# Patient Record
Sex: Male | Born: 1975 | Race: White | Hispanic: No | Marital: Married | Smoking: Former smoker
Health system: Southern US, Community
[De-identification: ages and names within clinical notes are randomized; demographics above are authoritative.]

## PROBLEM LIST (undated history)

## (undated) DIAGNOSIS — T7840XA Allergy, unspecified, initial encounter: Secondary | ICD-10-CM

## (undated) DIAGNOSIS — K219 Gastro-esophageal reflux disease without esophagitis: Secondary | ICD-10-CM

## (undated) DIAGNOSIS — R011 Cardiac murmur, unspecified: Secondary | ICD-10-CM

## (undated) HISTORY — DX: Gastro-esophageal reflux disease without esophagitis: K21.9

## (undated) HISTORY — DX: Allergy, unspecified, initial encounter: T78.40XA

## (undated) HISTORY — DX: Cardiac murmur, unspecified: R01.1

---

## 2011-01-12 ENCOUNTER — Ambulatory Visit (INDEPENDENT_AMBULATORY_CARE_PROVIDER_SITE_OTHER): Payer: 59

## 2011-01-12 DIAGNOSIS — J019 Acute sinusitis, unspecified: Secondary | ICD-10-CM

## 2011-01-12 DIAGNOSIS — R5381 Other malaise: Secondary | ICD-10-CM

## 2011-01-12 DIAGNOSIS — R05 Cough: Secondary | ICD-10-CM

## 2011-04-05 ENCOUNTER — Ambulatory Visit (INDEPENDENT_AMBULATORY_CARE_PROVIDER_SITE_OTHER): Payer: 59 | Admitting: Family Medicine

## 2011-04-05 VITALS — BP 139/86 | HR 87 | Temp 98.2°F | Resp 16 | Ht 72.25 in | Wt 285.8 lb

## 2011-04-05 DIAGNOSIS — J069 Acute upper respiratory infection, unspecified: Secondary | ICD-10-CM

## 2011-04-05 DIAGNOSIS — R05 Cough: Secondary | ICD-10-CM

## 2011-04-05 MED ORDER — AZITHROMYCIN 250 MG PO TABS: ORAL_TABLET | ORAL | Status: AC

## 2011-04-05 MED ORDER — HYDROCODONE-HOMATROPINE 5-1.5 MG/5ML PO SYRP: 5.0000 mL | ORAL_SOLUTION | Freq: Three times a day (TID) | ORAL | Status: AC | PRN

## 2011-04-05 NOTE — Progress Notes (Signed)
  Subjective:    Patient ID: Jay Townsend, male    DOB: 08-07-1975, 36 y.o.   MRN: [id number]  HPI 36 yo male here with cough for 3 days.  Started with head congestion Friday.  Cough developed and now hurting in ribs to cough.  Still with head congestion. Cough keeping him awake at night.  No fevers.  Cough productive clear.  "light" smoker.  Throat, ears fine.   Taking tylenol cold and flu - took edge off but not all the way.    Review of Systems Negative except as per HPI     Objective:   Physical Exam  Constitutional: He appears well-developed. No distress.  HENT:  Right Ear: Tympanic membrane, external ear and ear canal normal. Tympanic membrane is not injected, not scarred, not perforated, not erythematous, not retracted and not bulging.  Left Ear: Tympanic membrane, external ear and ear canal normal. Tympanic membrane is not injected, not scarred, not perforated, not erythematous, not retracted and not bulging.  Nose: No mucosal edema or rhinorrhea. Right sinus exhibits no maxillary sinus tenderness and no frontal sinus tenderness. Left sinus exhibits no maxillary sinus tenderness and no frontal sinus tenderness.  Mouth/Throat: Uvula is midline, oropharynx is clear and moist and mucous membranes are normal. No oropharyngeal exudate or tonsillar abscesses.  Cardiovascular: Normal rate, regular rhythm, normal heart sounds and intact distal pulses.   No murmur heard. Pulmonary/Chest: Effort normal and breath sounds normal. No respiratory distress. He has no wheezes. He has no rales.  Lymphadenopathy:       Head (right side): No submandibular and no preauricular adenopathy present.       Head (left side): No submandibular and no preauricular adenopathy present.       Right cervical: No superficial cervical and no posterior cervical adenopathy present.      Left cervical: No superficial cervical and no posterior cervical adenopathy present.       Right: No supraclavicular adenopathy  present.       Left: No supraclavicular adenopathy present.  Skin: Skin is warm and dry.          Assessment & Plan:  Cough, URI - hycodan for sleep.  Zpak to take if not improving in 3-4 days or worsens.

## 2011-04-21 ENCOUNTER — Ambulatory Visit (INDEPENDENT_AMBULATORY_CARE_PROVIDER_SITE_OTHER): Payer: 59 | Admitting: Family Medicine

## 2011-04-21 VITALS — BP 133/87 | HR 90 | Temp 98.7°F | Resp 18 | Ht 71.0 in | Wt 285.0 lb

## 2011-04-21 DIAGNOSIS — J40 Bronchitis, not specified as acute or chronic: Secondary | ICD-10-CM

## 2011-04-21 MED ORDER — PREDNISONE 20 MG PO TABS: ORAL_TABLET | ORAL | Status: AC

## 2011-04-21 NOTE — Progress Notes (Signed)
  Subjective:    Patient ID: Jay Townsend, male    DOB: 02-Dec-1975, 36 y.o.   MRN: [id number]  HPI 36 yo male with cough.  Seen 4/1 and diagnosed with URI.  Given hycodan to help with sleep and zpak to take if not better in 3-4 days.  Did take it and still coughs.  Out of hycodan - stopped helping halfway through bottle.  No nasal, throat, ear symptoms.  No fever.  No shortness of breath.    Review of Systems Negative except as per HPI     Objective:   Physical Exam  Constitutional: He appears well-developed. No distress.  HENT:  Right Ear: Tympanic membrane, external ear and ear canal normal. Tympanic membrane is not injected, not scarred, not perforated, not erythematous, not retracted and not bulging.  Left Ear: Tympanic membrane, external ear and ear canal normal. Tympanic membrane is not injected, not scarred, not perforated, not erythematous, not retracted and not bulging.  Nose: No mucosal edema or rhinorrhea. Right sinus exhibits no maxillary sinus tenderness and no frontal sinus tenderness. Left sinus exhibits no maxillary sinus tenderness and no frontal sinus tenderness.  Mouth/Throat: Uvula is midline, oropharynx is clear and moist and mucous membranes are normal. No oropharyngeal exudate or tonsillar abscesses.  Cardiovascular: Normal rate, regular rhythm, normal heart sounds and intact distal pulses.   No murmur heard. Pulmonary/Chest: Effort normal and breath sounds normal. No respiratory distress. He has no wheezes. He has no rales.  Lymphadenopathy:       Head (right side): No submandibular and no preauricular adenopathy present.       Head (left side): No submandibular and no preauricular adenopathy present.       Right cervical: No superficial cervical and no posterior cervical adenopathy present.      Left cervical: No superficial cervical and no posterior cervical adenopathy present.       Right: No supraclavicular adenopathy present.       Left: No supraclavicular  adenopathy present.  Skin: Skin is warm and dry.          Assessment & Plan:  Cough, bronchitis - predtaper.

## 2011-04-26 ENCOUNTER — Ambulatory Visit (INDEPENDENT_AMBULATORY_CARE_PROVIDER_SITE_OTHER): Payer: 59 | Admitting: Emergency Medicine

## 2011-04-26 ENCOUNTER — Ambulatory Visit: Payer: 59

## 2011-04-26 VITALS — BP 142/82 | HR 80 | Temp 98.5°F | Resp 16 | Ht 71.0 in | Wt 296.6 lb

## 2011-04-26 DIAGNOSIS — R05 Cough: Secondary | ICD-10-CM

## 2011-04-26 DIAGNOSIS — R0602 Shortness of breath: Secondary | ICD-10-CM

## 2011-04-26 LAB — POCT CBC
Lymph, poc: 2 (ref 0.6–3.4)
MCH, POC: 28.5 pg (ref 27–31.2)
MCHC: 32.4 g/dL (ref 31.8–35.4)
MID (cbc): 0.4 (ref 0–0.9)
MPV: 8.6 fL (ref 0–99.8)
POC LYMPH PERCENT: 22.2 %L (ref 10–50)
POC MID %: 4.5 %M (ref 0–12)
Platelet Count, POC: 348 10*3/uL (ref 142–424)
RDW, POC: 12.7 %
WBC: 8.8 10*3/uL (ref 4.6–10.2)

## 2011-04-26 NOTE — Progress Notes (Signed)
  Subjective:    Patient ID: Jay Townsend, male    DOB: 15-Sep-1975, 36 y.o.   MRN: [id number]  HPI patient was seen earlier in April. He was treated as an upper respiratory infection. He initially was treated with a Z-Pak. He came back approximately 5 days ago because of increasing cough. He was treated with a prednisone taper. He woke up this morning and was very short of breath. He had some symptoms last night. He's had a mild cough. He denies chest pain. Let's night he had 2 episodes of coughing and had to sit up a to feel better .This  Morning just getting dressed to get ready to go to work he felt short of breath and felt like he couldn't go to work because of the shortness of breath.He denies hemoptysis or calf pain.    Review of Systems     Objective:   Physical Exam His HEENT exam reveals wax impaction of both external canals. His throat is clear. Chest exam reveals diminished breath sounds on the right. Calves reveal no tenderness or swelling. His cardiac exam reveals a regular rate and rhythm no murmurs rubs or gallops patient  [facility] reading (PRIMARY) by  Dr.Aleyna Cueva chest x-ray shows cardiomegaly with predominant markings in the bases  EKG normal sinus rhythm ST-T changes inferiorly.     Assessment & Plan:  We'll start with a CBC and chest x-ray. Following this we'll get an EKG if these initial tests are unrevealing. 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 Will have patient evaluated by Dr. Jacinto Halim.

## 2011-10-31 ENCOUNTER — Telehealth: Payer: Self-pay

## 2011-10-31 ENCOUNTER — Ambulatory Visit (INDEPENDENT_AMBULATORY_CARE_PROVIDER_SITE_OTHER): Payer: 59 | Admitting: Emergency Medicine

## 2011-10-31 VITALS — BP 125/79 | HR 86 | Temp 98.3°F | Resp 18 | Ht 71.5 in | Wt 259.0 lb

## 2011-10-31 DIAGNOSIS — R1013 Epigastric pain: Secondary | ICD-10-CM

## 2011-10-31 LAB — POCT CBC
Granulocyte percent: 66.3 %G (ref 37–80)
HCT, POC: 46.3 % (ref 43.5–53.7)
MCV: 93 fL (ref 80–97)
POC Granulocyte: 4.8 (ref 2–6.9)
RBC: 4.98 M/uL (ref 4.69–6.13)

## 2011-10-31 LAB — COMPREHENSIVE METABOLIC PANEL
ALT: 9 U/L (ref 0–53)
Albumin: 4.3 g/dL (ref 3.5–5.2)
Alkaline Phosphatase: 94 U/L (ref 39–117)
CO2: 28 mEq/L (ref 19–32)
Glucose, Bld: 87 mg/dL (ref 70–99)
Potassium: 4.4 mEq/L (ref 3.5–5.3)
Sodium: 141 mEq/L (ref 135–145)
Total Protein: 6.7 g/dL (ref 6.0–8.3)

## 2011-10-31 LAB — AMYLASE: Amylase: 35 U/L (ref 0–105)

## 2011-10-31 MED ORDER — ESOMEPRAZOLE MAGNESIUM 40 MG PO CPDR: 40.0000 mg | DELAYED_RELEASE_CAPSULE | Freq: Every day | ORAL | Status: DC

## 2011-10-31 NOTE — Telephone Encounter (Signed)
Dr Dareen Piano stated to call pharmacy and change rx to omeprazole 40mg , I contacted pharmacy and pt is aware that an alternative has been called in.

## 2011-10-31 NOTE — Telephone Encounter (Signed)
[facility] [place] states pts copay for nexium is $140 and asks for an alternative.  Pt has called them twice and pt has called here also.  Pt saw dr Ellen Henri  [facility]: [phone] Please call pt when resolved: [phone]

## 2011-10-31 NOTE — Progress Notes (Signed)
[facility] [address] [place] [address] [phone]  Date:  10/31/2011   Name:  Jay Townsend   DOB:  [birth year]   MRN:  [id number]  PCP:  Tally Due, MD    Chief Complaint: Abdominal Pain   History of Present Illness:  Jay Townsend is a 36 y.o. very pleasant male patient who presents with the following:  Ill since Friday with epigastric-RUQ pain associated with fatty meals.  Occasional heartburn.  No nausea or vomiting.  Has some right shoulder blade pain with the pain in his abdomen.  No history of gallbladder disease.  No fever or chills, icterus or stool change.  No OTC treatment.  No pain yesterday morning as he did not eat breakfast and is fasting now with no pain this morning.  Had pain with meals Friday night, lunch and supper yesterday.  Drinks a lot of carbonated, caffeine beverages.  No excess NSAID or ASA.  Smoker   There is no problem list on file for this patient.   No past medical history on file.  No past surgical history on file.  History  Substance Use Topics  . Smoking status: Current Some Day Smoker  . Smokeless tobacco: Not on file  . Alcohol Use: Not on file    No family history on file.  Allergies  Allergen Reactions  . Penicillins Nausea And Vomiting    Medication list has been reviewed and updated.  No current outpatient prescriptions on file prior to visit.    Review of Systems:  As per HPI, otherwise negative.    Physical Examination: Filed Vitals:   10/31/11 0834  BP: 125/79  Pulse: 86  Temp: 98.3 F (36.8 C)  Resp: 18   Filed Vitals:   10/31/11 0834  Height: 5' 11.5" (1.816 m)  Weight: 259 lb (117.482 kg)   Body mass index is 35.62 kg/(m^2). Ideal Body Weight: Weight in (lb) to have BMI = 25: 181.4   GEN: WDWN, NAD, Non-toxic, A & O x 3 HEENT: Atraumatic, Normocephalic. Neck supple. No masses, No LAD. Ears and Nose: No external deformity. CV: RRR, No M/G/R. No JVD. No  thrill. No extra heart sounds. PULM: CTA B, no wheezes, crackles, rhonchi. No retractions. No resp. distress. No accessory muscle use. ABD: S, tender epigastrium, ND, +BS. No rebound. No HSM. EXTR: No c/c/e NEURO Normal gait.  PSYCH: Normally interactive. Conversant. Not depressed or anxious appearing.  Calm demeanor.    Assessment and Plan: Abdominal pain Labs nexium Follow up as needed and for failure to improve.  Carmelina Dane, MD  Results for orders placed in visit on 10/31/11  H. PYLORI ANTIBODY, IGG      Result Value Range   H Pylori IgG <0.40    COMPREHENSIVE METABOLIC PANEL      Result Value Range   Sodium 141  135 - 145 mEq/L   Potassium 4.4  3.5 - 5.3 mEq/L   Chloride 108  96 - 112 mEq/L   CO2 28  19 - 32 mEq/L   Glucose, Bld 87  70 - 99 mg/dL   BUN 10  6 - 23 mg/dL   Creat 2.95  6.21 - 3.08 mg/dL   Total Bilirubin 1.0  0.3 - 1.2 mg/dL   Alkaline Phosphatase 94  39 - 117 U/L   AST 14  0 - 37 U/L   ALT 9  0 - 53 U/L   Total Protein 6.7  6.0 - 8.3 g/dL   Albumin 4.3  3.5 - 5.2 g/dL   Calcium 9.8  8.4 - 16.1 mg/dL  AMYLASE      Result Value Range   Amylase 35  0 - 105 U/L  LIPASE      Result Value Range   Lipase 12  0 - 75 U/L  POCT CBC      Result Value Range   WBC 7.3  4.6 - 10.2 K/uL   Lymph, poc 2.0  0.6 - 3.4   POC LYMPH PERCENT 27.2  10 - 50 %L   MID (cbc) 0.5  0 - 0.9   POC MID % 6.5  0 - 12 %M   POC Granulocyte 4.8  2 - 6.9   Granulocyte percent 66.3  37 - 80 %G   RBC 4.98  4.69 - 6.13 M/uL   Hemoglobin 14.6  14.1 - 18.1 g/dL   HCT, POC 09.6  04.5 - 53.7 %   MCV 93.0  80 - 97 fL   MCH, POC 29.3  27 - 31.2 pg   MCHC 31.5 (*) 31.8 - 35.4 g/dL   RDW, POC 40.9     Platelet Count, POC 316  142 - 424 K/uL   MPV 8.8  0 - 99.8 fL   I have reviewed and agree with documentation. Robert P. Merla Riches, M.D.

## 2011-11-01 ENCOUNTER — Telehealth: Payer: Self-pay

## 2011-11-01 LAB — H. PYLORI ANTIBODY, IGG: H Pylori IgG: <0.4 {ISR}

## 2011-11-01 NOTE — Telephone Encounter (Signed)
Pt is looking for lab results

## 2011-11-02 NOTE — Telephone Encounter (Signed)
Patient has been advised on voicemail of normal lab results.

## 2012-01-21 ENCOUNTER — Ambulatory Visit (INDEPENDENT_AMBULATORY_CARE_PROVIDER_SITE_OTHER): Payer: 59 | Admitting: Family Medicine

## 2012-01-21 VITALS — BP 144/98 | HR 87 | Temp 98.7°F | Resp 18 | Ht 71.0 in | Wt 263.0 lb

## 2012-01-21 DIAGNOSIS — K219 Gastro-esophageal reflux disease without esophagitis: Secondary | ICD-10-CM

## 2012-01-21 DIAGNOSIS — K2970 Gastritis, unspecified, without bleeding: Secondary | ICD-10-CM

## 2012-01-21 DIAGNOSIS — R0602 Shortness of breath: Secondary | ICD-10-CM

## 2012-01-21 LAB — POCT CBC
Granulocyte percent: 63.7 %G (ref 37–80)
HCT, POC: 46.1 % (ref 43.5–53.7)
Hemoglobin: 14.6 g/dL (ref 14.1–18.1)
POC Granulocyte: 5 (ref 2–6.9)
RBC: 4.98 M/uL (ref 4.69–6.13)
RDW, POC: 13 %

## 2012-01-21 MED ORDER — SUCRALFATE 1 GM/10ML PO SUSP: 1.0000 g | Freq: Four times a day (QID) | ORAL | Status: DC

## 2012-01-21 MED ORDER — OMEPRAZOLE 40 MG PO CPDR: 40.0000 mg | DELAYED_RELEASE_CAPSULE | Freq: Every day | ORAL | Status: DC

## 2012-01-21 NOTE — Patient Instructions (Addendum)
Try to avoid the triggers for reflux as below, and decrease caffeine intake. Continue the acid blocker once per day. Can add the carafate 4 time per day for this next 5-7 days. Return to the clinic or go to the nearest emergency room if any of your symptoms worsen or new symptoms occur. If your shortness of breath persists, or any worsening/new symptoms including any chest pain - go to an emergency room.   Diet for Gastroesophageal Reflux Disease, Adult Reflux (acid reflux) is when acid from your stomach flows up into the esophagus. When acid comes in contact with the esophagus, the acid causes irritation and soreness (inflammation) in the esophagus. When reflux happens often or so severely that it causes damage to the esophagus, it is called gastroesophageal reflux disease (GERD). Nutrition therapy can help ease the discomfort of GERD. FOODS OR DRINKS TO AVOID OR LIMIT  Smoking or chewing tobacco. Nicotine is one of the most potent stimulants to acid production in the gastrointestinal tract.  Caffeinated and decaffeinated coffee and black tea.  Regular or low-calorie carbonated beverages or energy drinks (caffeine-free carbonated beverages are allowed).   Strong spices, such as black pepper, white pepper, red pepper, cayenne, curry powder, and chili powder.  Peppermint or spearmint.  Chocolate.  High-fat foods, including meats and fried foods. Extra added fats including oils, butter, salad dressings, and nuts. Limit these to less than 8 tsp per day.  Fruits and vegetables if they are not tolerated, such as citrus fruits or tomatoes.  Alcohol.  Any food that seems to aggravate your condition. If you have questions regarding your diet, call your caregiver or a registered dietitian. OTHER THINGS THAT MAY HELP GERD INCLUDE:   Eating your meals slowly, in a relaxed setting.  Eating 5 to 6 small meals per day instead of 3 large meals.  Eliminating food for a period of time if it causes  distress.  Not lying down until 3 hours after eating a meal.  Keeping the head of your bed raised 6 to 9 inches (15 to 23 cm) by using a foam wedge or blocks under the legs of the bed. Lying flat may make symptoms worse.  Being physically active. Weight loss may be helpful in reducing reflux in overweight or obese adults.  Wear loose fitting clothing EXAMPLE MEAL PLAN This meal plan is approximately 2,000 calories based on https://www.[facility].org/ meal planning guidelines. Breakfast   cup cooked oatmeal.  1 cup strawberries.  1 cup low-fat milk.  1 oz almonds. Snack  1 cup cucumber slices.  6 oz yogurt (made from low-fat or fat-free milk). Lunch  2 slice whole-wheat bread.  2 oz sliced [place].  2 tsp mayonnaise.  1 cup blueberries.  1 cup snap peas. Snack  6 whole-wheat crackers.  1 oz string cheese. Dinner   cup brown rice.  1 cup mixed veggies.  1 tsp olive oil.  3 oz grilled fish. Document Released: 12/21/2004 Document Revised: 03/15/2011 Document Reviewed: 11/06/2010 [facility] Patient Information 2013 [place], [place]. Gastroesophageal Reflux Disease, Adult Gastroesophageal reflux disease (GERD) happens when acid from your stomach flows up into the esophagus. When acid comes in contact with the esophagus, the acid causes soreness (inflammation) in the esophagus. Over time, GERD may create small holes (ulcers) in the lining of the esophagus. CAUSES   Increased body weight. This puts pressure on the stomach, making acid rise from the stomach into the esophagus.  Smoking. This increases acid production in the stomach.  Drinking alcohol. This  causes decreased pressure in the lower esophageal sphincter (valve or ring of muscle between the esophagus and stomach), allowing acid from the stomach into the esophagus.  Late evening meals and a full stomach. This increases pressure and acid production in the stomach.  A malformed lower esophageal  sphincter. Sometimes, no cause is found. SYMPTOMS   Burning pain in the lower part of the mid-chest behind the breastbone and in the mid-stomach area. This may occur twice a week or more often.  Trouble swallowing.  Sore throat.  Dry cough.  Asthma-like symptoms including chest tightness, shortness of breath, or wheezing. DIAGNOSIS  Your caregiver may be able to diagnose GERD based on your symptoms. In some cases, X-rays and other tests may be done to check for complications or to check the condition of your stomach and esophagus. TREATMENT  Your caregiver may recommend over-the-counter or prescription medicines to help decrease acid production. Ask your caregiver before starting or adding any new medicines.  HOME CARE INSTRUCTIONS   Change the factors that you can control. Ask your caregiver for guidance concerning weight loss, quitting smoking, and alcohol consumption.  Avoid foods and drinks that make your symptoms worse, such as:  Caffeine or alcoholic drinks.  Chocolate.  Peppermint or mint flavorings.  Garlic and onions.  Spicy foods.  Citrus fruits, such as oranges, lemons, or limes.  Tomato-based foods such as sauce, chili, salsa, and pizza.  Fried and fatty foods.  Avoid lying down for the 3 hours prior to your bedtime or prior to taking a nap.  Eat small, frequent meals instead of large meals.  Wear loose-fitting clothing. Do not wear anything tight around your waist that causes pressure on your stomach.  Raise the head of your bed 6 to 8 inches with wood blocks to help you sleep. Extra pillows will not help.  Only take over-the-counter or prescription medicines for pain, discomfort, or fever as directed by your caregiver.  Do not take aspirin, ibuprofen, or other nonsteroidal anti-inflammatory drugs (NSAIDs). SEEK IMMEDIATE MEDICAL CARE IF:   You have pain in your arms, neck, jaw, teeth, or back.  Your pain increases or changes in intensity or  duration.  You develop nausea, vomiting, or sweating (diaphoresis).  You develop shortness of breath, or you faint.  Your vomit is green, yellow, black, or looks like coffee grounds or blood.  Your stool is red, bloody, or black. These symptoms could be signs of other problems, such as heart disease, gastric bleeding, or esophageal bleeding. MAKE SURE YOU:   Understand these instructions.  Will watch your condition.  Will get help right away if you are not doing well or get worse. Document Released: 09/30/2004 Document Revised: 03/15/2011 Document Reviewed: 07/10/2010 [facility] Patient Information 2013 [place], [place].

## 2012-01-21 NOTE — Progress Notes (Signed)
Subjective:    Patient ID: Jay Townsend, male    DOB: 27-Jan-1975, 37 y.o.   MRN: [id number]  HPI Jay Townsend is a 37 y.o. male  Hx of reflux - initially started in October - took omeprazole daily since then.  Had been doing better - no breakthrough symptoms until 3 days ago. Feels intermittent heartburn since then.  Mild belching with this.  Eats spicy foods, but less recently. Discomfort at upper abdomen.  Intermittent dyspnea with exertion at work.  No chest pain.   2-3 12 ounce cans of diet mtn dew per day.   H pylori negative 10/31/11.  Review of Systems  Constitutional: Negative for fever and chills.  Respiratory: Positive for shortness of breath (with exertion at work.  noted more in past few days. ). Negative for chest tightness.   Cardiovascular: Negative for chest pain.  Gastrointestinal: Negative for nausea, vomiting, diarrhea, blood in stool and anal bleeding. Constipation: intermittent. harder stool this morning.        Objective:   Physical Exam  Constitutional: He is oriented to person, place, and time. He appears well-developed and well-nourished.  HENT:  Head: Normocephalic and atraumatic.  Eyes: EOM are normal. Pupils are equal, round, and reactive to light.  Neck: No JVD present. Carotid bruit is not present.  Cardiovascular: Normal rate, regular rhythm and normal heart sounds.   No murmur heard. Pulmonary/Chest: Effort normal and breath sounds normal. He has no rales.  Abdominal: Soft. Normal appearance and bowel sounds are normal. There is tenderness in the epigastric area. There is no rebound, no guarding and no CVA tenderness.  Musculoskeletal: He exhibits no edema.  Neurological: He is alert and oriented to person, place, and time.  Skin: Skin is warm and dry.  Psychiatric: He has a normal mood and affect.   EKG: SR, nonspecific t in III, no acute findings.   Results for orders placed in visit on 01/21/12  POCT CBC      Component Value Range   WBC 7.8  4.6 - 10.2 K/uL   Lymph, poc 2.4  0.6 - 3.4   POC LYMPH PERCENT 31.3  10 - 50 %L   MID (cbc) 0.4  0 - 0.9   POC MID % 5.0  0 - 12 %M   POC Granulocyte 5.0  2 - 6.9   Granulocyte percent 63.7  37 - 80 %G   RBC 4.98  4.69 - 6.13 M/uL   Hemoglobin 14.6  14.1 - 18.1 g/dL   HCT, POC 40.9  81.1 - 53.7 %   MCV 92.6  80 - 97 fL   MCH, POC 29.3  27 - 31.2 pg   MCHC 31.7 (*) 31.8 - 35.4 g/dL   RDW, POC 91.4     Platelet Count, POC 314  142 - 424 K/uL   MPV 8.7  0 - 99.8 fL       Assessment & Plan:  Jay Townsend is a 37 y.o. male 1. SOB (shortness of breath)  EKG 12-Lead, POCT CBC.  Reassuring ekg overall.  May be due to GERD sx's, but rt/er/911 precautions discussed.   2. Acid reflux  POCT CBC, sucralfate (CARAFATE) 1 GM/10ML suspension - next 5-7 days.  Trigger avoidance. Recheck if not improving in next week - sooner if worse. Plan oin recheck GERD status in few weeks as may need GI eval if persistent sx's.  3. Gastritis  sucralfate (CARAFATE) 1 GM/10ML suspension   Patient Instructions  Try to avoid the triggers for reflux as below, and decrease caffeine intake. Continue the acid blocker once per day. Can add the carafate 4 time per day for this next 5-7 days. Return to the clinic or go to the nearest emergency room if any of your symptoms worsen or new symptoms occur. If your shortness of breath persists, or any worsening/new symptoms including any chest pain - go to an emergency room.   Diet for Gastroesophageal Reflux Disease, Adult Reflux (acid reflux) is when acid from your stomach flows up into the esophagus. When acid comes in contact with the esophagus, the acid causes irritation and soreness (inflammation) in the esophagus. When reflux happens often or so severely that it causes damage to the esophagus, it is called gastroesophageal reflux disease (GERD). Nutrition therapy can help ease the discomfort of GERD. FOODS OR DRINKS TO AVOID OR LIMIT  Smoking or chewing  tobacco. Nicotine is one of the most potent stimulants to acid production in the gastrointestinal tract.  Caffeinated and decaffeinated coffee and black tea.  Regular or low-calorie carbonated beverages or energy drinks (caffeine-free carbonated beverages are allowed).   Strong spices, such as black pepper, white pepper, red pepper, cayenne, curry powder, and chili powder.  Peppermint or spearmint.  Chocolate.  High-fat foods, including meats and fried foods. Extra added fats including oils, butter, salad dressings, and nuts. Limit these to less than 8 tsp per day.  Fruits and vegetables if they are not tolerated, such as citrus fruits or tomatoes.  Alcohol.  Any food that seems to aggravate your condition. If you have questions regarding your diet, call your caregiver or a registered dietitian. OTHER THINGS THAT MAY HELP GERD INCLUDE:   Eating your meals slowly, in a relaxed setting.  Eating 5 to 6 small meals per day instead of 3 large meals.  Eliminating food for a period of time if it causes distress.  Not lying down until 3 hours after eating a meal.  Keeping the head of your bed raised 6 to 9 inches (15 to 23 cm) by using a foam wedge or blocks under the legs of the bed. Lying flat may make symptoms worse.  Being physically active. Weight loss may be helpful in reducing reflux in overweight or obese adults.  Wear loose fitting clothing EXAMPLE MEAL PLAN This meal plan is approximately 2,000 calories based on https://www.bernard.org/ meal planning guidelines. Breakfast   cup cooked oatmeal.  1 cup strawberries.  1 cup low-fat milk.  1 oz almonds. Snack  1 cup cucumber slices.  6 oz yogurt (made from low-fat or fat-free milk). Lunch  2 slice whole-wheat bread.  2 oz sliced [place].  2 tsp mayonnaise.  1 cup blueberries.  1 cup snap peas. Snack  6 whole-wheat crackers.  1 oz string cheese. Dinner   cup brown rice.  1 cup mixed veggies.  1 tsp  olive oil.  3 oz grilled fish. Document Released: 12/21/2004 Document Revised: 03/15/2011 Document Reviewed: 11/06/2010 [facility] Patient Information 2013 [place], [place]. Gastroesophageal Reflux Disease, Adult Gastroesophageal reflux disease (GERD) happens when acid from your stomach flows up into the esophagus. When acid comes in contact with the esophagus, the acid causes soreness (inflammation) in the esophagus. Over time, GERD may create small holes (ulcers) in the lining of the esophagus. CAUSES   Increased body weight. This puts pressure on the stomach, making acid rise from the stomach into the esophagus.  Smoking. This increases acid production in the stomach.  Drinking alcohol.  This causes decreased pressure in the lower esophageal sphincter (valve or ring of muscle between the esophagus and stomach), allowing acid from the stomach into the esophagus.  Late evening meals and a full stomach. This increases pressure and acid production in the stomach.  A malformed lower esophageal sphincter. Sometimes, no cause is found. SYMPTOMS   Burning pain in the lower part of the mid-chest behind the breastbone and in the mid-stomach area. This may occur twice a week or more often.  Trouble swallowing.  Sore throat.  Dry cough.  Asthma-like symptoms including chest tightness, shortness of breath, or wheezing. DIAGNOSIS  Your caregiver may be able to diagnose GERD based on your symptoms. In some cases, X-rays and other tests may be done to check for complications or to check the condition of your stomach and esophagus. TREATMENT  Your caregiver may recommend over-the-counter or prescription medicines to help decrease acid production. Ask your caregiver before starting or adding any new medicines.  HOME CARE INSTRUCTIONS   Change the factors that you can control. Ask your caregiver for guidance concerning weight loss, quitting smoking, and alcohol consumption.  Avoid foods and drinks  that make your symptoms worse, such as:  Caffeine or alcoholic drinks.  Chocolate.  Peppermint or mint flavorings.  Garlic and onions.  Spicy foods.  Citrus fruits, such as oranges, lemons, or limes.  Tomato-based foods such as sauce, chili, salsa, and pizza.  Fried and fatty foods.  Avoid lying down for the 3 hours prior to your bedtime or prior to taking a nap.  Eat small, frequent meals instead of large meals.  Wear loose-fitting clothing. Do not wear anything tight around your waist that causes pressure on your stomach.  Raise the head of your bed 6 to 8 inches with wood blocks to help you sleep. Extra pillows will not help.  Only take over-the-counter or prescription medicines for pain, discomfort, or fever as directed by your caregiver.  Do not take aspirin, ibuprofen, or other nonsteroidal anti-inflammatory drugs (NSAIDs). SEEK IMMEDIATE MEDICAL CARE IF:   You have pain in your arms, neck, jaw, teeth, or back.  Your pain increases or changes in intensity or duration.  You develop nausea, vomiting, or sweating (diaphoresis).  You develop shortness of breath, or you faint.  Your vomit is green, yellow, black, or looks like coffee grounds or blood.  Your stool is red, bloody, or black. These symptoms could be signs of other problems, such as heart disease, gastric bleeding, or esophageal bleeding. MAKE SURE YOU:   Understand these instructions.  Will watch your condition.  Will get help right away if you are not doing well or get worse. Document Released: 09/30/2004 Document Revised: 03/15/2011 Document Reviewed: 07/10/2010 [facility] Patient Information 2013 [place], [place].

## 2012-02-01 ENCOUNTER — Ambulatory Visit (INDEPENDENT_AMBULATORY_CARE_PROVIDER_SITE_OTHER): Payer: 59 | Admitting: Physician Assistant

## 2012-02-01 VITALS — BP 126/78 | HR 90 | Temp 99.0°F | Resp 16 | Ht 71.0 in | Wt 257.0 lb

## 2012-02-01 DIAGNOSIS — R05 Cough: Secondary | ICD-10-CM

## 2012-02-01 DIAGNOSIS — J111 Influenza due to unidentified influenza virus with other respiratory manifestations: Secondary | ICD-10-CM

## 2012-02-01 DIAGNOSIS — R509 Fever, unspecified: Secondary | ICD-10-CM

## 2012-02-01 LAB — POCT INFLUENZA A/B
Influenza A, POC: NEGATIVE
Influenza B, POC: NEGATIVE

## 2012-02-01 MED ORDER — HYDROCODONE-HOMATROPINE 5-1.5 MG/5ML PO SYRP: ORAL_SOLUTION | ORAL | Status: DC

## 2012-02-01 MED ORDER — OSELTAMIVIR PHOSPHATE 75 MG PO CAPS: 75.0000 mg | ORAL_CAPSULE | Freq: Two times a day (BID) | ORAL | Status: DC

## 2012-02-01 NOTE — Progress Notes (Signed)
Patient ID: Jay Townsend MRN: [id number], DOB: [birth year], 37 y.o. Date of Encounter: 02/01/2012, 9:15 AM  Primary Physician: Tally Due, MD  Chief Complaint: Sudden onset of fever, chills, myalgias, and congesiton  HPI: 37 y.o. year old male with history below presents with a one day history of sudden onset of sore throat, fever, chills, myalgias, nasal congestion, cough, and headache. T max uncertain. He developed onset of above symptoms while at work the previous day followed by worsening over the evening and night. Cough is nonproductive. No shortness of breath. Some wheezing. Felt so bad the previous night that he stayed on the sofa. No GI complaints. Decreased appetite. Boss with influenza a week or two ago. No influenza vaccine this year.  Past Medical History  Diagnosis Date  . GERD (gastroesophageal reflux disease)      Home Meds: Prior to Admission medications   Medication Sig Start Date End Date Taking? Authorizing Provider  omeprazole (PRILOSEC) 40 MG capsule Take 1 capsule (40 mg total) by mouth daily. 01/21/12  Yes Shade Flood, MD  sucralfate (CARAFATE) 1 GM/10ML suspension Take 10 mLs (1 g total) by mouth 4 (four) times daily. 01/21/12  Yes Shade Flood, MD           Allergies:  Allergies  Allergen Reactions  . Penicillins Nausea And Vomiting    History   Social History  . Marital Status: Married    Spouse Name: N/A    Number of Children: N/A  . Years of Education: N/A   Occupational History  . Not on file.   Social History Main Topics  . Smoking status: Current Some Day Smoker  . Smokeless tobacco: Not on file  . Alcohol Use: No  . Drug Use: No  . Sexually Active: Not on file   Other Topics Concern  . Not on file   Social History Narrative  . No narrative on file     Review of Systems: Constitutional: Positive for fever, chills, myalgias, and fatigue. Negative for weight changes.  HEENT: see above Cardiovascular: Negative  for chest pain or palpitations Respiratory: Positive for cough and wheezing. Negative for hemoptysis, shortness of breath, tachypnea, or dyspnea Abdominal: Negative for abdominal pain, nausea, vomiting, diarrhea, or constipation Dermatological: Negative for rash. Neurologic: Positive for headache. Negative for dizziness, vertigo, or syncope.   Physical Exam: Blood pressure 126/78, pulse 90, temperature 99 F (37.2 C), temperature source Oral, resp. rate 16, height 5\' 11"  (1.803 m), weight 257 lb (116.574 kg), SpO2 97.00%., Body mass index is 35.84 kg/(m^2). General: Well developed, well nourished, in no acute distress. Head: Normocephalic, atraumatic, eyes without discharge, sclera non-icteric, nares are congested. Bilateral auditory canals clear, TM's are without perforation, pearly grey and translucent with reflective cone of light bilaterally. Oral cavity moist, posterior pharynx without exudate, erythema, peritonsillar abscess, or post nasal drip. Uvula midline.  Neck: Supple. No thyromegaly. Full ROM. No lymphadenopathy. Lungs: Clear bilaterally to auscultation without wheezes, rales, or rhonchi. Breathing is unlabored. Heart: RRR with S1 S2. No murmurs, rubs, or gallops appreciated.  Msk:  Strength and tone normal for age. Extremities/Skin: Warm and dry. No clubbing or cyanosis. No edema. No rashes or suspicious lesions. Neuro: Alert and oriented X 3. Moves all extremities spontaneously. Gait is normal. CNII-XII grossly in tact. Psych:  Responds to questions appropriately with a normal affect.   Labs: Results for orders placed in visit on 02/01/12  POCT INFLUENZA A/B      Component Value  Range   Influenza A, POC Negative     Influenza B, POC Negative       ASSESSMENT AND PLAN:  37 y.o. year old male with influenza, fever, and cough. -Tamiflu 75 mg 1 po bid #10 no RF -Hycodan #4oz 1 tsp po q 4-6 hours prn cough no RF, SED -Tylenol/Motrin -Push fluids -Rest -Will need to be  out of work until afebrile 24 hours without the aid of antipyretics -Out of work note written through 02/04/12 -RTC precautions  Signed, Eula Listen, PA-C 02/01/2012 9:15 AM

## 2012-02-26 ENCOUNTER — Ambulatory Visit (INDEPENDENT_AMBULATORY_CARE_PROVIDER_SITE_OTHER): Payer: 59 | Admitting: Internal Medicine

## 2012-02-26 VITALS — BP 124/72 | HR 59 | Temp 98.2°F | Resp 18 | Ht 71.5 in | Wt 258.0 lb

## 2012-02-26 DIAGNOSIS — H6120 Impacted cerumen, unspecified ear: Secondary | ICD-10-CM

## 2012-02-26 DIAGNOSIS — K219 Gastro-esophageal reflux disease without esophagitis: Secondary | ICD-10-CM

## 2012-02-26 DIAGNOSIS — H6123 Impacted cerumen, bilateral: Secondary | ICD-10-CM

## 2012-02-26 DIAGNOSIS — H6690 Otitis media, unspecified, unspecified ear: Secondary | ICD-10-CM

## 2012-02-26 DIAGNOSIS — Z6835 Body mass index (BMI) 35.0-35.9, adult: Secondary | ICD-10-CM

## 2012-02-26 MED ORDER — CEFDINIR 300 MG PO CAPS: 300.0000 mg | ORAL_CAPSULE | Freq: Two times a day (BID) | ORAL | Status: DC

## 2012-02-26 NOTE — Progress Notes (Signed)
  Subjective:    Patient ID: Jay Townsend, male    DOB: [birth year], 37 y.o.   MRN: [id number]  HPI complaining of pain in the left ear for 5 days /worse when burps or swallows Mild decrease in hearing Started at the end of the 6 day illness that was the flu No fever No nasal congestion Occasional early morning cough  Past medical history-reflux on omeprazole  History of chronic use of Q-tips bilaterally  Review of Systems No fever No dizziness No headache    Objective:   Physical Exam Vital signs stable except overweight Pupils equal round reactive to light and accommodation /conjunctiva clear Both canals obstructed by cerumen Nares slightly boggy Throat clear No nodes Tragus pull nontender   Bilateral ear irrigation after Colace administration was successful Exam after irrigation revealed hyperemic left tympanic membrane/both canals cleared/no external otitis Subjective improvement in hearing      Assessment & Plan:  Problem #1 otitis media followin the flu Problem #2 cerumen impaction/chronic Q-tip usage Instructed him proper care of otic canals  Meds ordered this encounter  Medications  . cefdinir (OMNICEF) 300 MG capsule    Sig: Take 1 capsule (300 mg total) by mouth 2 (two) times daily.    Dispense:  20 capsule    Refill:  0

## 2012-03-20 ENCOUNTER — Ambulatory Visit (INDEPENDENT_AMBULATORY_CARE_PROVIDER_SITE_OTHER): Payer: 59 | Admitting: Emergency Medicine

## 2012-03-20 VITALS — BP 136/84 | HR 58 | Temp 98.1°F | Resp 16 | Ht 71.5 in | Wt 260.0 lb

## 2012-03-20 DIAGNOSIS — S20229A Contusion of unspecified back wall of thorax, initial encounter: Secondary | ICD-10-CM

## 2012-03-20 DIAGNOSIS — S20221A Contusion of right back wall of thorax, initial encounter: Secondary | ICD-10-CM

## 2012-03-20 LAB — POCT URINALYSIS DIPSTICK
Bilirubin, UA: NEGATIVE
Blood, UA: NEGATIVE
Glucose, UA: NEGATIVE
Ketones, UA: NEGATIVE
Leukocytes, UA: NEGATIVE
Nitrite, UA: NEGATIVE
Protein, UA: NEGATIVE
Spec Grav, UA: 1.02
Urobilinogen, UA: 0.2
pH, UA: 6

## 2012-03-20 MED ORDER — HYDROCODONE-ACETAMINOPHEN 5-325 MG PO TABS
1.0000 | ORAL_TABLET | ORAL | Status: DC | PRN
Start: 2012-03-20 — End: 2019-01-18

## 2012-03-20 NOTE — Patient Instructions (Addendum)
Back Pain, Adult Low back pain is very common. About 1 in 5 people have back pain.The cause of low back pain is rarely dangerous. The pain often gets better over time.About half of people with a sudden onset of back pain feel better in just 2 weeks. About 8 in 10 people feel better by 6 weeks.  CAUSES Some common causes of back pain include:  Strain of the muscles or ligaments supporting the spine.  Wear and tear (degeneration) of the spinal discs.  Arthritis.  Direct injury to the back. DIAGNOSIS Most of the time, the direct cause of low back pain is not known.However, back pain can be treated effectively even when the exact cause of the pain is unknown.Answering your caregiver's questions about your overall health and symptoms is one of the most accurate ways to make sure the cause of your pain is not dangerous. If your caregiver needs more information, he or she may order lab work or imaging tests (X-rays or MRIs).However, even if imaging tests show changes in your back, this usually does not require surgery. HOME CARE INSTRUCTIONS For many people, back pain returns.Since low back pain is rarely dangerous, it is often a condition that people can learn to manageon their own.   Remain active. It is stressful on the back to sit or stand in one place. Do not sit, drive, or stand in one place for more than 30 minutes at a time. Take short walks on level surfaces as soon as pain allows.Try to increase the length of time you walk each day.  Do not stay in bed.Resting more than 1 or 2 days can delay your recovery.  Do not avoid exercise or work.Your body is made to move.It is not dangerous to be active, even though your back may hurt.Your back will likely heal faster if you return to being active before your pain is gone.  Pay attention to your body when you bend and lift. Many people have less discomfortwhen lifting if they bend their knees, keep the load close to their bodies,and  avoid twisting. Often, the most comfortable positions are those that put less stress on your recovering back.  Find a comfortable position to sleep. Use a firm mattress and lie on your side with your knees slightly bent. If you lie on your back, put a pillow under your knees.  Only take over-the-counter or prescription medicines as directed by your caregiver. Over-the-counter medicines to reduce pain and inflammation are often the most helpful.Your caregiver may prescribe muscle relaxant drugs.These medicines help dull your pain so you can more quickly return to your normal activities and healthy exercise.  Put ice on the injured area.  Put ice in a plastic bag.  Place a towel between your skin and the bag.  Leave the ice on for 15 to 20 minutes, 3 to 4 times a day for the first 2 to 3 days. After that, ice and heat may be alternated to reduce pain and spasms.  Ask your caregiver about trying back exercises and gentle massage. This may be of some benefit.  Avoid feeling anxious or stressed.Stress increases muscle tension and can worsen back pain.It is important to recognize when you are anxious or stressed and learn ways to manage it.Exercise is a great option. SEEK MEDICAL CARE IF:  You have pain that is not relieved with rest or medicine.  You have pain that does not improve in 1 week.  You have new symptoms.  You are generally   not feeling well. SEEK IMMEDIATE MEDICAL CARE IF:   You have pain that radiates from your back into your legs.  You develop new bowel or bladder control problems.  You have unusual weakness or numbness in your arms or legs.  You develop nausea or vomiting.  You develop abdominal pain.  You feel faint. Document Released: 12/21/2004 Document Revised: 06/22/2011 Document Reviewed: 05/11/2010 [employer] Patient Information 2013 [employer].  

## 2012-03-20 NOTE — Progress Notes (Signed)
[facility] [address] [place] [address] [phone]  Date:  03/20/2012   Name:  Jay Townsend   DOB:  1975/05/29   MRN:  [id number]  PCP:  Tally Due, MD    Chief Complaint: Fall   History of Present Illness:  Jay Townsend is a 37 y.o. very pleasant male patient who presents with the following:  Slipped this morning on an icy step and fell, injuring his right low back.  Denies any numbness tingling or weakness in the legs.  Pain not radiating. No hematuria, wheezing, shortness of breath or hemoptysis.  No abdominal or neck pain.  No improvement with over the counter medications or other home remedies. Denies other complaint or health concern today.   Patient Active Problem List  Diagnosis  . Esophageal reflux  . BMI 35.0-35.9,adult    Past Medical History  Diagnosis Date  . GERD (gastroesophageal reflux disease)   . Allergy   . Heart murmur     History reviewed. No pertinent past surgical history.  History  Substance Use Topics  . Smoking status: Current Some Day Smoker -- 0.10 packs/day for 5 years    Types: Cigarettes  . Smokeless tobacco: Current User    Types: Snuff  . Alcohol Use: No    Family History  Problem Relation Age of Onset  . Diabetes Father   . Drug abuse Paternal Grandmother   . Heart disease Paternal Grandfather     Allergies  Allergen Reactions  . Bee Venom Swelling    Mainly allergic to yellow jackets  . Penicillins Nausea And Vomiting    Medication list has been reviewed and updated.  Current Outpatient Prescriptions on File Prior to Visit  Medication Sig Dispense Refill  . omeprazole (PRILOSEC) 40 MG capsule Take 1 capsule (40 mg total) by mouth daily.  30 capsule  1  . cefdinir (OMNICEF) 300 MG capsule Take 1 capsule (300 mg total) by mouth 2 (two) times daily.  20 capsule  0   No current facility-administered medications on file prior to visit.    Review of Systems:  As per HPI,  otherwise negative.    Physical Examination: Filed Vitals:   03/20/12 1046  BP: 136/84  Pulse: 58  Temp: 98.1 F (36.7 C)  Resp: 16   Filed Vitals:   03/20/12 1046  Height: 5' 11.5" (1.816 m)  Weight: 260 lb (117.935 kg)   Body mass index is 35.76 kg/(m^2). Ideal Body Weight: Weight in (lb) to have BMI = 25: 181.4  GEN: WDWN, NAD, Non-toxic, A & O x 3 HEENT: Atraumatic, Normocephalic. Neck supple. No masses, No LAD. Ears and Nose: No external deformity. CV: RRR, No M/G/R. No JVD. No thrill. No extra heart sounds. PULM: CTA B, no wheezes, crackles, rhonchi. No retractions. No resp. distress. No accessory muscle use. ABD: S, NT, ND, +BS. No rebound. No HSM. EXTR: No c/c/e NEURO Normal gait.  PSYCH: Normally interactive. Conversant. Not depressed or anxious appearing.  Calm demeanor.  BACK:  Ecchymosis right CVA region with tenderness.    Assessment and Plan: Contusion back Ice vicodin   Carmelina Dane, MD  Results for orders placed in visit on 03/20/12  POCT URINALYSIS DIPSTICK      Result Value Range   Color, UA YELLOW     Clarity, UA CLEAR     Glucose, UA NEG     Bilirubin, UA NEG     Ketones, UA NEG  Spec Grav, UA 1.020     Blood, UA NEG     pH, UA 6.0     Protein, UA NEG     Urobilinogen, UA 0.2     Nitrite, UA NEG     Leukocytes, UA Negative

## 2012-03-31 NOTE — Progress Notes (Signed)
Reviewed and agree.

## 2012-04-07 ENCOUNTER — Encounter: Payer: Self-pay | Admitting: Emergency Medicine

## 2019-01-18 ENCOUNTER — Ambulatory Visit
Admission: EM | Admit: 2019-01-18 | Discharge: 2019-01-18 | Disposition: A | Discharge status: Home / Self Care | Payer: Managed Care, Other (non HMO) | Attending: Physician Assistant | Admitting: Physician Assistant

## 2019-01-18 DIAGNOSIS — Z20822 Contact with and (suspected) exposure to covid-19: Secondary | ICD-10-CM

## 2019-01-18 DIAGNOSIS — R059 Cough, unspecified: Secondary | ICD-10-CM

## 2019-01-18 DIAGNOSIS — R05 Cough: Secondary | ICD-10-CM | POA: Diagnosis not present

## 2019-01-18 MED ORDER — IPRATROPIUM BROMIDE 0.06 % NA SOLN: 2.0000 | Freq: Four times a day (QID) | NASAL | 0 refills | Status: DC

## 2019-01-18 MED ORDER — ALBUTEROL SULFATE HFA 108 (90 BASE) MCG/ACT IN AERS: 1.0000 | INHALATION_SPRAY | Freq: Four times a day (QID) | RESPIRATORY_TRACT | 0 refills | Status: AC | PRN

## 2019-01-18 MED ORDER — BENZONATATE 200 MG PO CAPS: 200.0000 mg | ORAL_CAPSULE | Freq: Three times a day (TID) | ORAL | 0 refills | Status: DC

## 2019-01-18 MED ORDER — PREDNISONE 50 MG PO TABS: 50.0000 mg | ORAL_TABLET | Freq: Every day | ORAL | 0 refills | Status: DC

## 2019-01-18 NOTE — ED Provider Notes (Signed)
[name]    CSN: [id number] Arrival date & time: 01/18/19  1008      History   Chief Complaint Chief Complaint  Patient presents with  . Cough    HPI Jay Townsend is a 44 y.o. male.   44 year old male comes in for 4 day of URI symptoms. Cough, nasal congestion, post nasal drip. tmax 99.3, motrin this morning. Denies fever, chills, body aches. Denies abdominal pain, nausea, vomiting, diarrhea. Denies shortness of breath, loss of taste/smell. No sick contact. Former smoker. States he gets bronchitis each year during this time.     Past Medical History:  Diagnosis Date  . Allergy   . GERD (gastroesophageal reflux disease)   . Heart murmur     Patient Active Problem List   Diagnosis Date Noted  . Esophageal reflux 02/26/2012  . BMI 35.0-35.9,adult 02/26/2012    History reviewed. No pertinent surgical history.     Home Medications    Prior to Admission medications   Medication Sig Start Date End Date Taking? Authorizing Provider  albuterol (VENTOLIN HFA) 108 (90 Base) MCG/ACT inhaler Inhale 1-2 puffs into the lungs every 6 (six) hours as needed for wheezing or shortness of breath. 01/18/19   Tasia Catchings, Jumana Paccione V, PA-C  benzonatate (TESSALON) 200 MG capsule Take 1 capsule (200 mg total) by mouth every 8 (eight) hours. 01/18/19   Tasia Catchings, Everette Mall V, PA-C  ipratropium (ATROVENT) 0.06 % nasal spray Place 2 sprays into both nostrils 4 (four) times daily. 01/18/19   Ok Edwards, PA-C  predniSONE (DELTASONE) 50 MG tablet Take 1 tablet (50 mg total) by mouth daily with breakfast. 01/18/19   Ok Edwards, PA-C    Family History Family History  Problem Relation Age of Onset  . Diabetes Father   . Drug abuse Paternal Grandmother   . Heart disease Paternal Grandfather     Social History Social History   Tobacco Use  . Smoking status: Former Smoker    Packs/day: 0.10    Years: 5.00    Pack years: 0.50    Types: Cigarettes  . Smokeless tobacco: Never Used  Substance Use  Topics  . Alcohol use: No  . Drug use: No     Allergies   Bee venom and Penicillins   Review of Systems Review of Systems  Reason unable to perform ROS: See HPI as above.     Physical Exam Triage Vital Signs ED Triage Vitals [01/18/19 1029]  Enc Vitals Group     BP (!) 144/93     Pulse Rate 90     Resp 18     Temp 98.5 F (36.9 C)     Temp Source Oral     SpO2 96 %     Weight      Height      Head Circumference      Peak Flow      Pain Score 0     Pain Loc      Pain Edu?      Excl. in [place]?    No data found.  Updated Vital Signs BP (!) 144/93 (BP Location: Left Arm)   Pulse 90   Temp 98.5 F (36.9 C) (Oral)   Resp 18   SpO2 96%   Physical Exam Constitutional:      General: He is not in acute distress.    Appearance: Normal appearance. He is not ill-appearing, toxic-appearing or diaphoretic.  HENT:     Head:  Normocephalic and atraumatic.     Mouth/Throat:     Mouth: Mucous membranes are moist.     Pharynx: Oropharynx is clear. Uvula midline.  Cardiovascular:     Rate and Rhythm: Normal rate and regular rhythm.     Heart sounds: Normal heart sounds. No murmur. No friction rub. No gallop.   Pulmonary:     Effort: Pulmonary effort is normal. No accessory muscle usage, prolonged expiration, respiratory distress or retractions.     Comments: Lungs clear to auscultation without adventitious lung sounds. Musculoskeletal:     Cervical back: Normal range of motion and neck supple.  Neurological:     General: No focal deficit present.     Mental Status: He is alert and oriented to person, place, and time.      UC Treatments / Results  Labs (all labs ordered are listed, but only abnormal results are displayed) Labs Reviewed  NOVEL CORONAVIRUS, NAA    EKG   Radiology No results found.  Procedures Procedures (including critical care time)  Medications Ordered in UC Medications - No data to display  Initial Impression / Assessment and Plan / UC  Course  I have reviewed the triage vital signs and the nursing notes.  Pertinent labs & imaging results that were available during my care of the patient were reviewed by me and considered in my medical decision making (see chart for details).    COVID PCR test ordered. Patient to quarantine until testing results return. No alarming signs on exam.  Patient speaking in full sentences without respiratory distress.  Symptomatic treatment discussed.  Push fluids.  Return precautions given.  Patient expresses understanding and agrees to plan.  Final Clinical Impressions(s) / UC Diagnoses   Final diagnoses:  Cough   ED Prescriptions    Medication Sig Dispense Auth. Provider   predniSONE (DELTASONE) 50 MG tablet Take 1 tablet (50 mg total) by mouth daily with breakfast. 5 tablet Annaliyah Willig V, PA-C   albuterol (VENTOLIN HFA) 108 (90 Base) MCG/ACT inhaler Inhale 1-2 puffs into the lungs every 6 (six) hours as needed for wheezing or shortness of breath. 8 g Nishka Heide V, PA-C   benzonatate (TESSALON) 200 MG capsule Take 1 capsule (200 mg total) by mouth every 8 (eight) hours. 21 capsule Luvern Mcisaac V, PA-C   ipratropium (ATROVENT) 0.06 % nasal spray Place 2 sprays into both nostrils 4 (four) times daily. 15 mL Belinda Fisher, PA-C     PDMP not reviewed this encounter.   Belinda Fisher, PA-C 01/18/19 1203

## 2019-01-18 NOTE — ED Triage Notes (Signed)
Pt c/o cough and chest congestion since 4 days

## 2019-01-18 NOTE — Discharge Instructions (Addendum)
COVID PCR testing ordered. I would like you to quarantine until testing results. Prednisone for bronchitic cough. Albuterol as needed. Tessalon for cough as well. Atrovent nasal spray for nasal congestion. Tylenol/motrin for pain and fever. Keep hydrated, urine should be clear to pale yellow in color. If experiencing shortness of breath, trouble breathing, go to the emergency department for further evaluation needed.

## 2019-01-19 LAB — NOVEL CORONAVIRUS, NAA: SARS-CoV-2, NAA: DETECTED — AB

## 2019-01-22 ENCOUNTER — Telehealth (HOSPITAL_COMMUNITY): Payer: Self-pay | Admitting: Emergency Medicine

## 2019-01-22 NOTE — Telephone Encounter (Signed)
Your test for COVID-19 was positive, meaning that you were infected with the novel coronavirus and could give the germ to others. Please continue isolation at home for at least 10 days since the start of your symptoms. If you do not have symptoms, please isolate at home for 10 days from the day you were tested. Once you complete your 10 day quarantine, you may return to normal activities as long as you've not had a fever for over 24 hours(without taking fever reducing medicine) and your symptoms are improving. Please continue good preventive care measures, including: frequent hand-washing, avoid touching your face, cover coughs/sneezes, stay out of crowds and keep a 6 foot distance from others. Go to the nearest hospital emergency room if fever/cough/breathlessness are severe or illness seems like a threat to life.  Patient contacted by phone and made aware of  results. Pt verbalized understanding and had all questions answered.  Work note sent thru [employer].

## 2019-04-02 ENCOUNTER — Ambulatory Visit
Admission: EM | Admit: 2019-04-02 | Discharge: 2019-04-02 | Disposition: A | Discharge status: Home / Self Care | Payer: Managed Care, Other (non HMO) | Attending: Physician Assistant | Admitting: Physician Assistant

## 2019-04-02 DIAGNOSIS — M778 Other enthesopathies, not elsewhere classified: Secondary | ICD-10-CM

## 2019-04-02 MED ORDER — DICLOFENAC SODIUM 75 MG PO TBEC: 75.0000 mg | DELAYED_RELEASE_TABLET | Freq: Two times a day (BID) | ORAL | 0 refills | Status: AC

## 2019-04-02 NOTE — ED Triage Notes (Signed)
Pt c/o lt elbow pain for over a month, some swelling today, pain getting worse. Denies injury.

## 2019-04-02 NOTE — ED Provider Notes (Signed)
[name]    CSN: [id number] Arrival date & time: 04/02/19  1311      History   Chief Complaint Chief Complaint  Patient presents with  . Elbow Pain    HPI ANGELUS HOOPES is a 44 y.o. male.   44 year old male comes in for 1 month history of intermittent atraumatic left elbow pain.  States pain has been more consistent this past week.  Can feel the most pain during range of motion, heavy lifting.  Denies swelling, erythema, warmth.  Denies fever, chills, body aches.  Denies loss of grip strength.     Past Medical History:  Diagnosis Date  . Allergy   . GERD (gastroesophageal reflux disease)   . Heart murmur     Patient Active Problem List   Diagnosis Date Noted  . Esophageal reflux 02/26/2012  . BMI 35.0-35.9,adult 02/26/2012    History reviewed. No pertinent surgical history.     Home Medications    Prior to Admission medications   Medication Sig Start Date End Date Taking? Authorizing Provider  albuterol (VENTOLIN HFA) 108 (90 Base) MCG/ACT inhaler Inhale 1-2 puffs into the lungs every 6 (six) hours as needed for wheezing or shortness of breath. 01/18/19   Belinda Fisher, PA-C  diclofenac (VOLTAREN) 75 MG EC tablet Take 1 tablet (75 mg total) by mouth 2 (two) times daily. 04/02/19   Belinda Fisher, PA-C    Family History Family History  Problem Relation Age of Onset  . Diabetes Father   . Drug abuse Paternal Grandmother   . Heart disease Paternal Grandfather     Social History Social History   Tobacco Use  . Smoking status: Former Smoker    Packs/day: 0.10    Years: 5.00    Pack years: 0.50    Types: Cigarettes  . Smokeless tobacco: Never Used  Substance Use Topics  . Alcohol use: No  . Drug use: No     Allergies   Bee venom and Penicillins   Review of Systems Review of Systems  Reason unable to perform ROS: See HPI as above.     Physical Exam Triage Vital Signs ED Triage Vitals [04/02/19 1321]  Enc Vitals Group     BP (!)  157/83     Pulse Rate 75     Resp 16     Temp 97.9 F (36.6 C)     Temp Source Oral     SpO2      Weight      Height      Head Circumference      Peak Flow      Pain Score 2     Pain Loc      Pain Edu?      Excl. in [place]?    No data found.  Updated Vital Signs BP (!) 157/83 (BP Location: Left Arm)   Pulse 75   Temp 97.9 F (36.6 C) (Oral)   Resp 16   Physical Exam Constitutional:      General: He is not in acute distress.    Appearance: Normal appearance. He is well-developed. He is not toxic-appearing or diaphoretic.  HENT:     Head: Normocephalic and atraumatic.  Eyes:     Conjunctiva/sclera: Conjunctivae normal.     Pupils: Pupils are equal, round, and reactive to light.  Pulmonary:     Effort: Pulmonary effort is normal. No respiratory distress.     Comments: Speaking in full sentences without difficulty Musculoskeletal:  Cervical back: Normal range of motion and neck supple.     Comments: No swelling, erythema, warmth, contusion.  No tenderness to palpation of the shoulder or wrist.  Tenderness to palpation along medial epicondyle, lateral elbow slightly distal to lateral epicondyle.  No tenderness to palpation of rest of the forearm.  Slight decrease in extension.  Strength 5/5 BUE. Sensation 5/5 BUE. Radial pulse 2+  Skin:    General: Skin is warm and dry.  Neurological:     Mental Status: He is alert and oriented to person, place, and time.      UC Treatments / Results  Labs (all labs ordered are listed, but only abnormal results are displayed) Labs Reviewed - No data to display  EKG   Radiology No results found.  Procedures Procedures (including critical care time)  Medications Ordered in UC Medications - No data to display  Initial Impression / Assessment and Plan / UC Course  I have reviewed the triage vital signs and the nursing notes.  Pertinent labs & imaging results that were available during my care of the patient were reviewed by  me and considered in my medical decision making (see chart for details).    History and exam most consistent with left elbow tendinitis.  NSAIDs, ice compress.  Patient's work requires repetitive motion/heavy lifting, and likely unable to fully rest elbow.  If symptoms not improving with NSAIDs after 5 to 7 days, okay to switch to prednisone 50 mg daily x5 days, D#5, R#0.  Return precautions given.  Final Clinical Impressions(s) / UC Diagnoses   Final diagnoses:  Left elbow tendinitis    ED Prescriptions    Medication Sig Dispense Auth. Provider   diclofenac (VOLTAREN) 75 MG EC tablet Take 1 tablet (75 mg total) by mouth 2 (two) times daily. 30 tablet Ok Edwards, PA-C     PDMP not reviewed this encounter.   Ok Edwards, PA-C 04/02/19 1344

## 2019-04-02 NOTE — Discharge Instructions (Signed)
Start Diclofenac. Do not take ibuprofen (motrin/advil)/ naproxen (aleve) while on diclofenac. Ice compress, rest. Call if symptoms not improving, may need to switch to prednisone.

## 2020-12-01 ENCOUNTER — Ambulatory Visit
Admission: EM | Admit: 2020-12-01 | Discharge: 2020-12-01 | Disposition: A | Discharge status: Home / Self Care | Payer: Managed Care, Other (non HMO) | Attending: Internal Medicine | Admitting: Internal Medicine

## 2020-12-01 ENCOUNTER — Encounter: Payer: Self-pay | Admitting: Emergency Medicine

## 2020-12-01 ENCOUNTER — Other Ambulatory Visit: Payer: Self-pay

## 2020-12-01 ENCOUNTER — Ambulatory Visit (INDEPENDENT_AMBULATORY_CARE_PROVIDER_SITE_OTHER): Payer: Managed Care, Other (non HMO)

## 2020-12-01 DIAGNOSIS — M25562 Pain in left knee: Secondary | ICD-10-CM

## 2020-12-01 NOTE — Discharge Instructions (Signed)
Your knee x-ray was normal.  Suspect that you have a muscle or ligament strain.  A knee brace has been applied in urgent care to help immobilize and help with pain.  You may take over-the-counter pain relief as well.  Also apply ice to affected area of pain.  Follow-up with provided contact information for orthopedist if pain persists over the next 5 to 7 days.

## 2020-12-01 NOTE — ED Provider Notes (Signed)
[name]    CSN: [id number] Arrival date & time: 12/01/20  0845      History   Chief Complaint Chief Complaint  Patient presents with   Knee Injury    HPI Jay Townsend is a 45 y.o. male.   Patient presents with left knee pain that started yesterday after an injury.  Patient reports that he was standing up and twisted the left when he heard his knee pop 4 times.  Pain has been present ever since this.  Patient is able to bear weight but with difficulty.  Patient denies any numbness or tingling.  Pain is rated 2/10 on pain scale at rest and pain is exacerbated with movement.  Pain is present on the anterior portion of the knee.  Patient reports that he also works on his knees on a daily basis, so he is not sure if this is exacerbated the pain.  Denies any chronic pain to the knee or any past injuries.    Past Medical History:  Diagnosis Date   Allergy    GERD (gastroesophageal reflux disease)    Heart murmur     Patient Active Problem List   Diagnosis Date Noted   Esophageal reflux 02/26/2012   BMI 35.0-35.9,adult 02/26/2012    History reviewed. No pertinent surgical history.     Home Medications    Prior to Admission medications   Medication Sig Start Date End Date Taking? Authorizing Provider  albuterol (VENTOLIN HFA) 108 (90 Base) MCG/ACT inhaler Inhale 1-2 puffs into the lungs every 6 (six) hours as needed for wheezing or shortness of breath. 01/18/19   Belinda Fisher, PA-C  diclofenac (VOLTAREN) 75 MG EC tablet Take 1 tablet (75 mg total) by mouth 2 (two) times daily. 04/02/19   Belinda Fisher, PA-C    Family History Family History  Problem Relation Age of Onset   Diabetes Father    Drug abuse Paternal Grandmother    Heart disease Paternal Grandfather     Social History Social History   Tobacco Use   Smoking status: Former    Packs/day: 0.10    Years: 5.00    Pack years: 0.50    Types: Cigarettes   Smokeless tobacco: Never  Substance Use  Topics   Alcohol use: No   Drug use: No     Allergies   Bee venom and Penicillins   Review of Systems Review of Systems Per HPI  Physical Exam Triage Vital Signs ED Triage Vitals [12/01/20 1033]  Enc Vitals Group     BP (!) 166/98     Pulse Rate 81     Resp 16     Temp 98 F (36.7 C)     Temp Source Oral     SpO2 95 %     Weight      Height      Head Circumference      Peak Flow      Pain Score 2     Pain Loc      Pain Edu?      Excl. in [place]?    No data found.  Updated Vital Signs BP (!) 166/98 (BP Location: Left Arm)   Pulse 81   Temp 98 F (36.7 C) (Oral)   Resp 16   SpO2 95%   Visual Acuity Right Eye Distance:   Left Eye Distance:   Bilateral Distance:    Right Eye Near:   Left Eye Near:    Bilateral  Near:     Physical Exam Constitutional:      General: He is not in acute distress.    Appearance: Normal appearance. He is not toxic-appearing or diaphoretic.  HENT:     Head: Normocephalic and atraumatic.  Eyes:     Extraocular Movements: Extraocular movements intact.     Conjunctiva/sclera: Conjunctivae normal.  Pulmonary:     Effort: Pulmonary effort is normal.  Musculoskeletal:     Right knee: Normal.     Left knee: No swelling, deformity, effusion, erythema, bony tenderness or crepitus. Normal range of motion. Tenderness present. No LCL laxity, MCL laxity, ACL laxity or PCL laxity.Normal alignment, normal meniscus and normal patellar mobility. Normal pulse.     Comments: Neurovascular intact.  Neurological:     General: No focal deficit present.     Mental Status: He is alert and oriented to person, place, and time. Mental status is at baseline.  Psychiatric:        Mood and Affect: Mood normal.        Behavior: Behavior normal.        Thought Content: Thought content normal.        Judgment: Judgment normal.     UC Treatments / Results  Labs (all labs ordered are listed, but only abnormal results are displayed) Labs Reviewed - No  data to display  EKG   Radiology DG Knee Complete 4 Views Left  Result Date: 12/01/2020 CLINICAL DATA:  Turned knee, pain and popping sensation yesterday EXAM: LEFT KNEE - COMPLETE 4+ VIEW COMPARISON:  None. FINDINGS: No evidence of fracture, dislocation, or joint effusion. No evidence of arthropathy or other focal bone abnormality. Soft tissues are unremarkable. IMPRESSION: No fracture or dislocation of the left knee. Joint spaces are well preserved. No knee joint effusion. Electronically Signed   By: Jearld Lesch M.D.   On: 12/01/2020 10:59    Procedures Procedures (including critical care time)  Medications Ordered in UC Medications - No data to display  Initial Impression / Assessment and Plan / UC Course  I have reviewed the triage vital signs and the nursing notes.  Pertinent labs & imaging results that were available during my care of the patient were reviewed by me and considered in my medical decision making (see chart for details).     Left knee x-ray was negative for any acute bony abnormality or joint effusion.  Physical exam is not consistent with any type of ligament tear.  Suspect muscular strain.  Knee immobilizer applied in urgent care.  Discussed RICE and supportive care with patient.  Advised patient to follow-up with orthopedist if pain persists. Final Clinical Impressions(s) / UC Diagnoses   Final diagnoses:  Acute pain of left knee     Discharge Instructions      Your knee x-ray was normal.  Suspect that you have a muscle or ligament strain.  A knee brace has been applied in urgent care to help immobilize and help with pain.  You may take over-the-counter pain relief as well.  Also apply ice to affected area of pain.  Follow-up with provided contact information for orthopedist if pain persists over the next 5 to 7 days.    ED Prescriptions   None    PDMP not reviewed this encounter.   Gustavus Bryant, [place] 12/01/20 [phone]

## 2020-12-01 NOTE — ED Triage Notes (Addendum)
Patient turned wrong on his left knee yesterday and felt it pop four times, has been in pain since. Pain with rest, movement worsens, 2/10 at rest. Denies any swelling or prior history of knee injury. Reports mild tenderness at anterior patella, unsure if it's new because is on knees at work

## 2022-04-10 IMAGING — DX DG KNEE COMPLETE 4+V*L*
5 series · 5 of 5 positions shown · non-contrast
Comparison: None.

CLINICAL DATA: Turned knee, pain and popping sensation yesterday

EXAM:
LEFT KNEE - COMPLETE 4+ VIEW

[knee standing ap (1 of 3)]
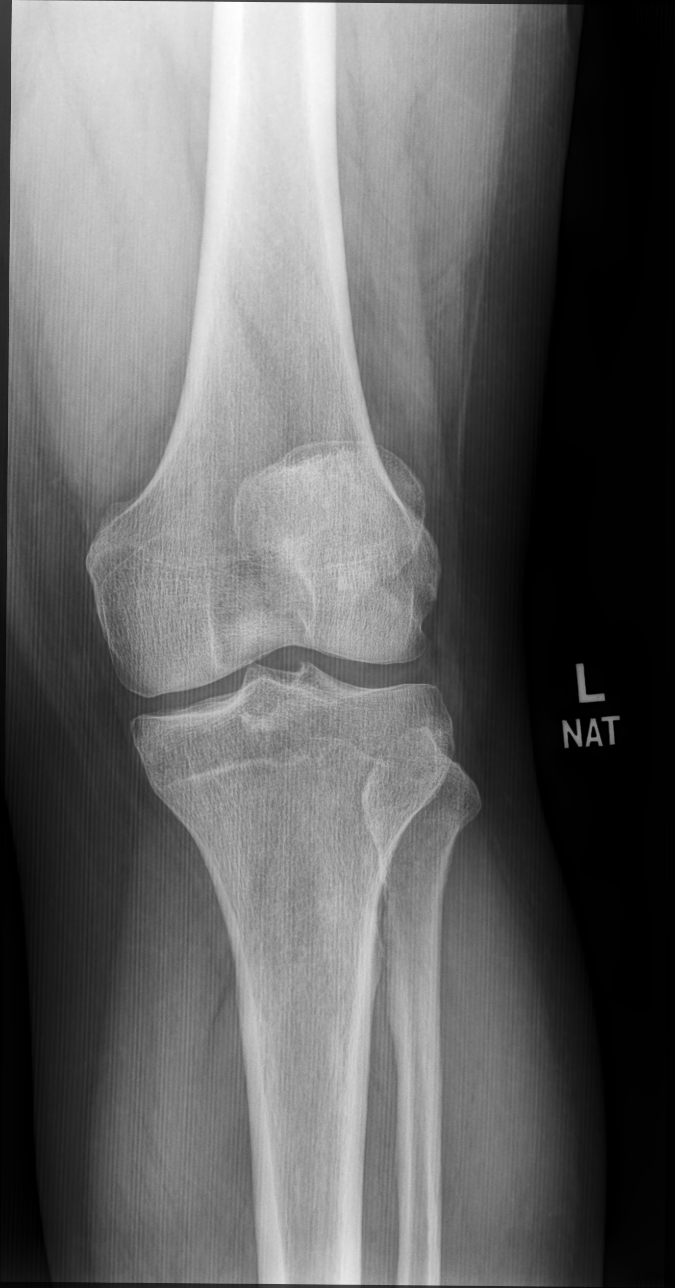

[knee standing ap (2 of 3)]
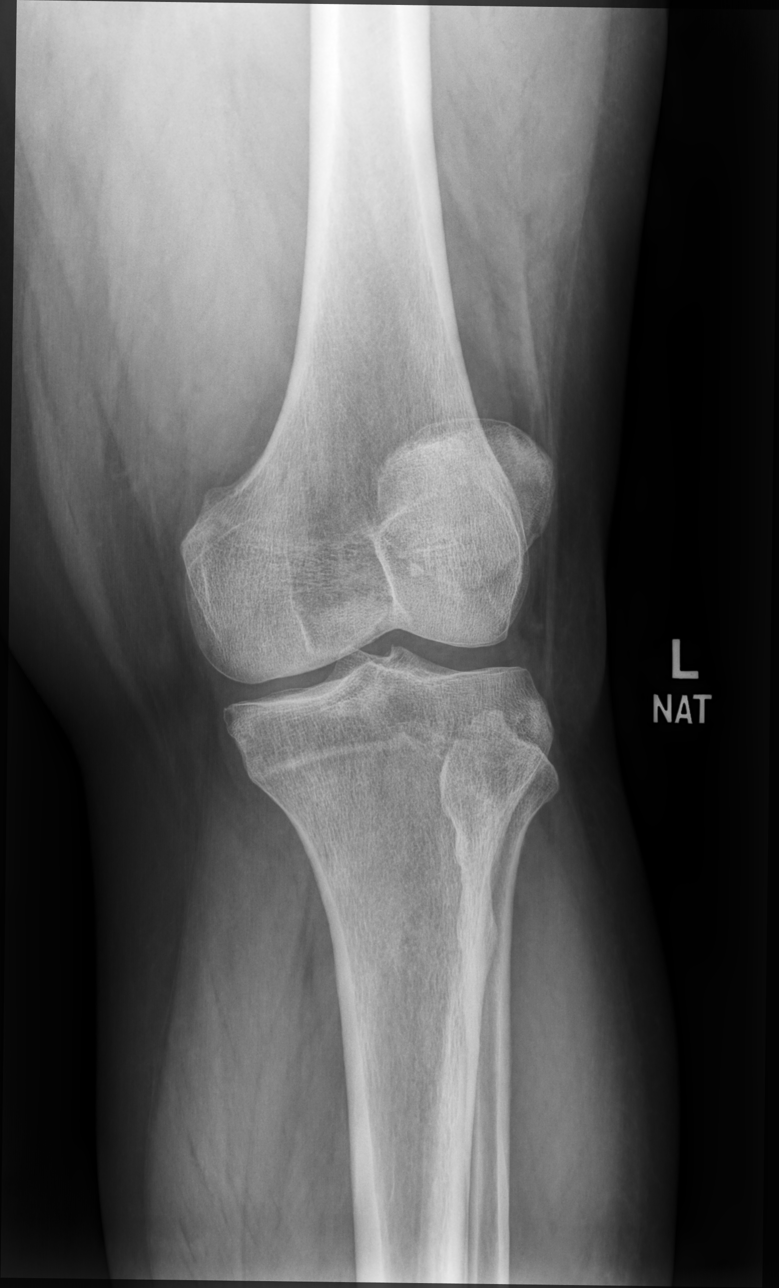

[knee standing ap (3 of 3)]
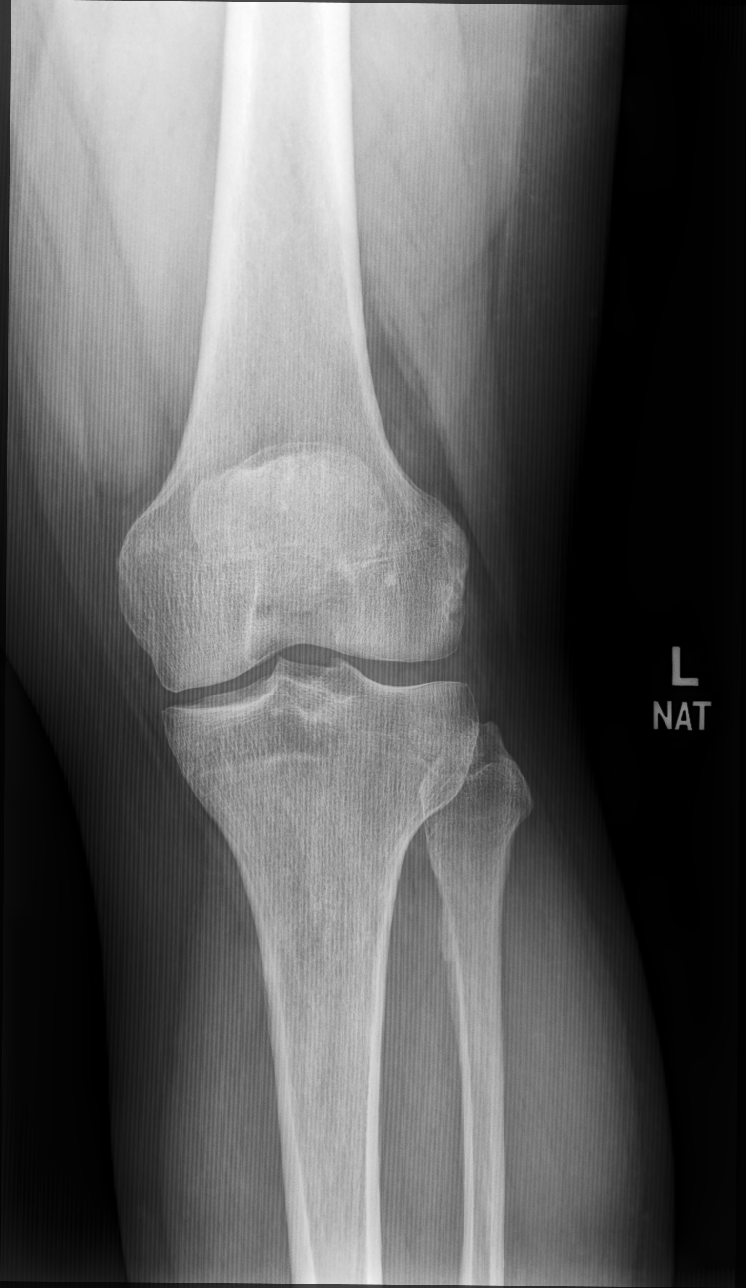

[knee standing lat]
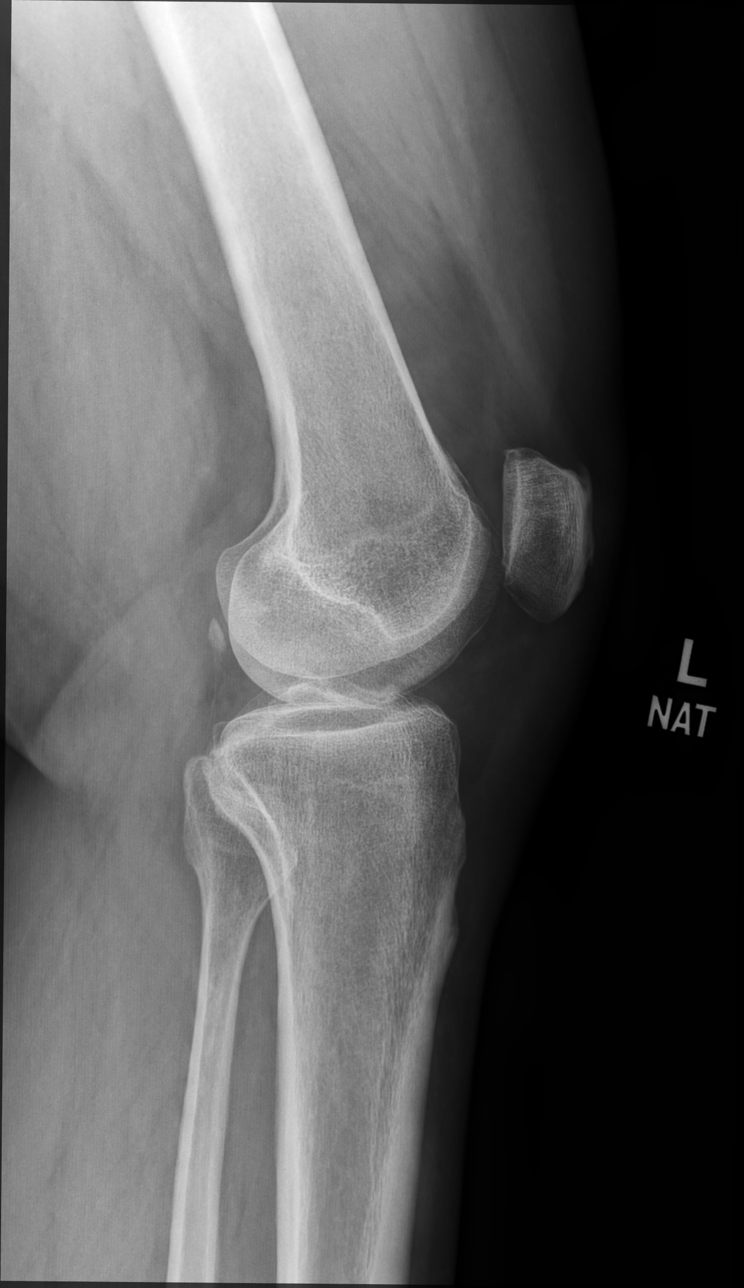

[patella tangential]
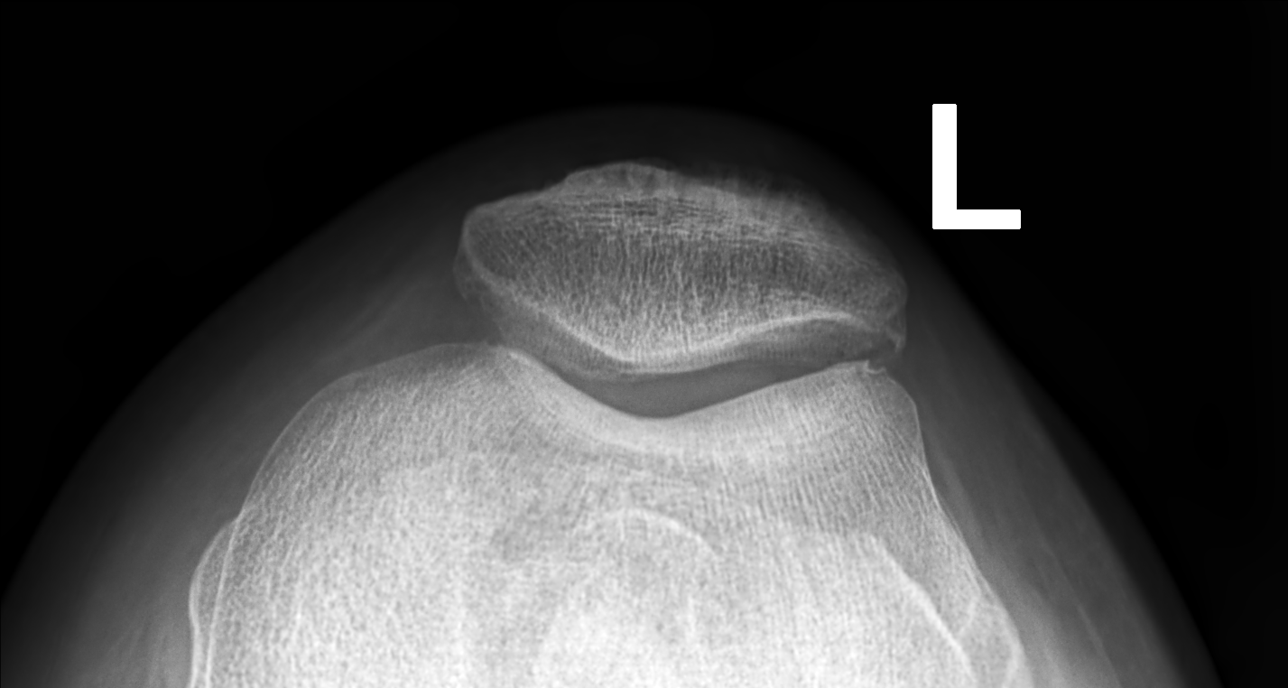

[5 of 5 positions shown; findings below may reference images not displayed]

FINDINGS: No evidence of fracture, dislocation, or joint effusion. No evidence
of arthropathy or other focal bone abnormality. Soft tissues are
unremarkable.
IMPRESSION: No fracture or dislocation of the left knee. Joint spaces are well
preserved. No knee joint effusion.
